# Patient Record
Sex: Female | Born: 2000 | Race: Black or African American | Hispanic: No | Marital: Single | Smoking: Never smoker
Health system: Southern US, Community
[De-identification: ages and names within clinical notes are randomized; demographics above are authoritative.]

---
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
## 2020-01-29 IMAGING — CT CT ABDOMEN AND PELVIS WITH CONTRAST
2 of 4 series · 16 of 46 positions shown, 18 images · IV contrast (omnipaque)
Comparison: None.

CLINICAL DATA: Acute generalized abdominal pain.

EXAM:
CT ABDOMEN AND PELVIS WITH CONTRAST
TECHNIQUE: Multidetector CT imaging of the abdomen and pelvis was performed
using the standard protocol following bolus administration of
intravenous contrast.
CONTRAST:  100mL OMNIPAQUE IOHEXOL 300 MG/ML  SOLN

[Series 3: abd/ pelvis 5.0 i30f 2 · axial · 0.89mm/px · z∈[+694,+1134]mm · 13 of 98 slices shown, 15 images]
[im 5/98  soft-tissue]
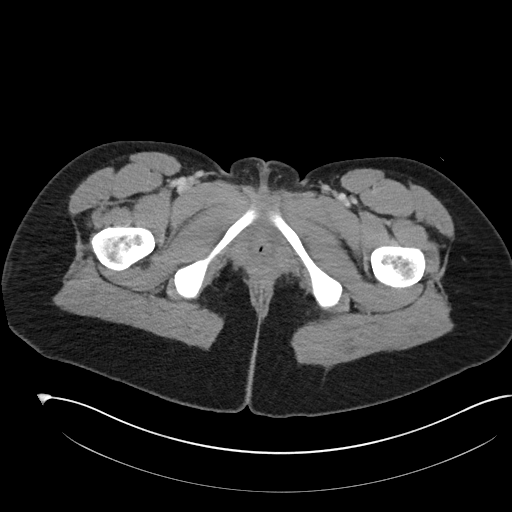
[im 5/98  bone]
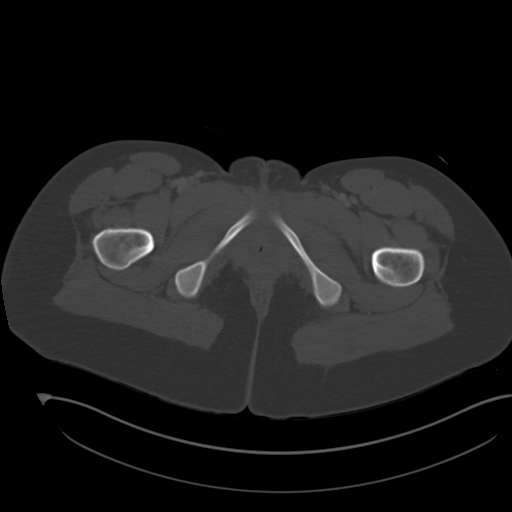
[im 13/98  soft-tissue]
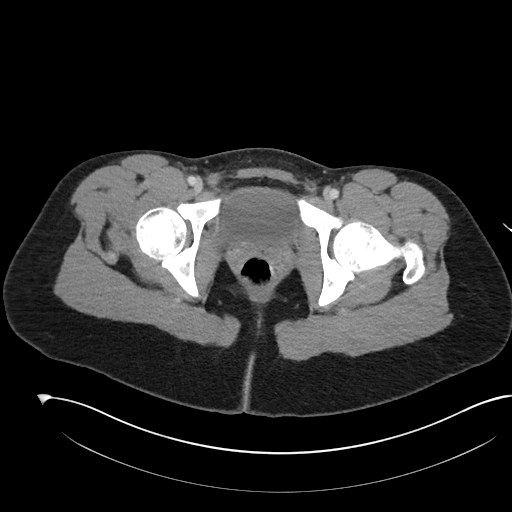
[im 21/98  soft-tissue]
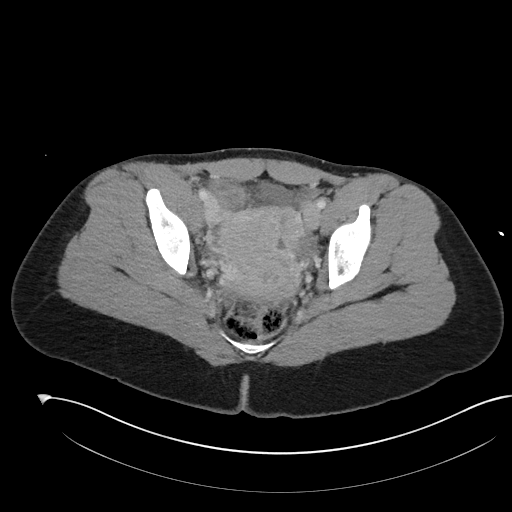
[im 29/98  soft-tissue]
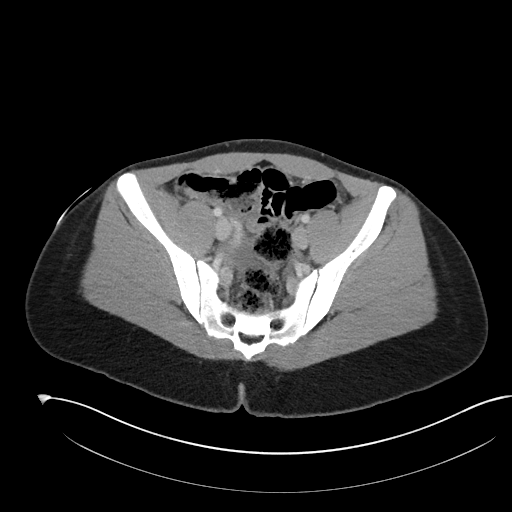
[im 33/98  soft-tissue]
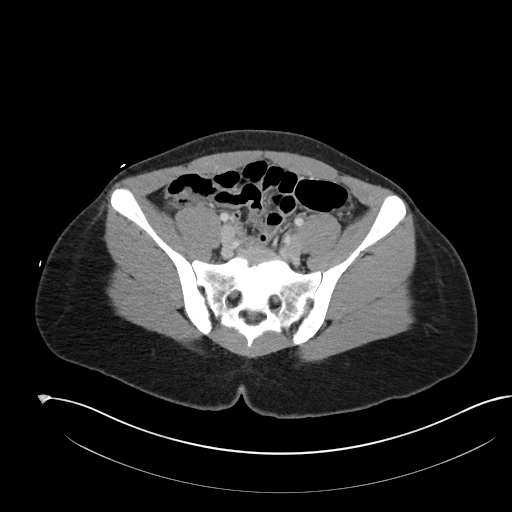
[im 41/98  soft-tissue]
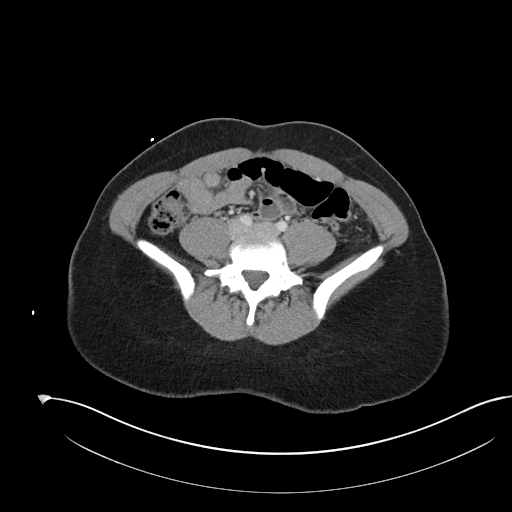
[im 49/98  soft-tissue]
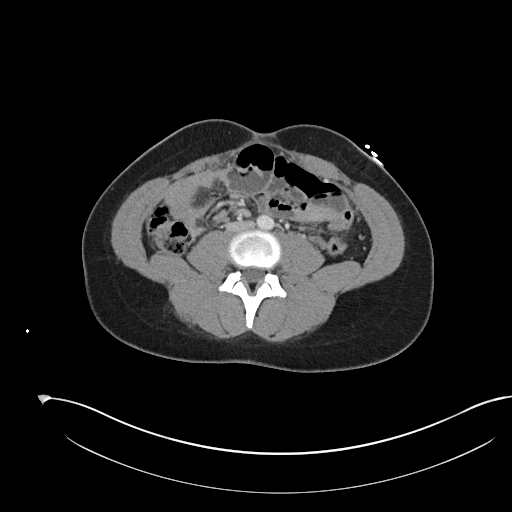
[im 57/98  soft-tissue]
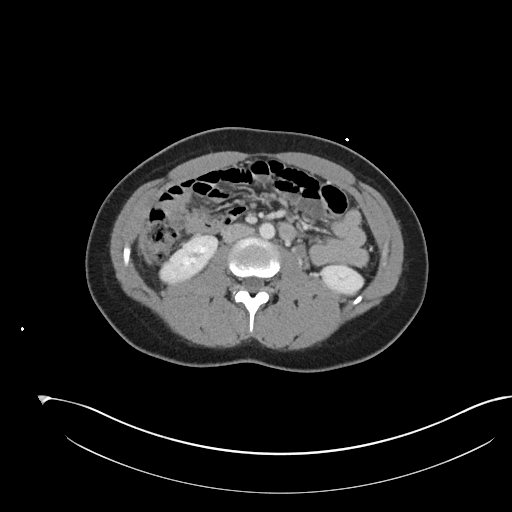
[im 65/98  soft-tissue]
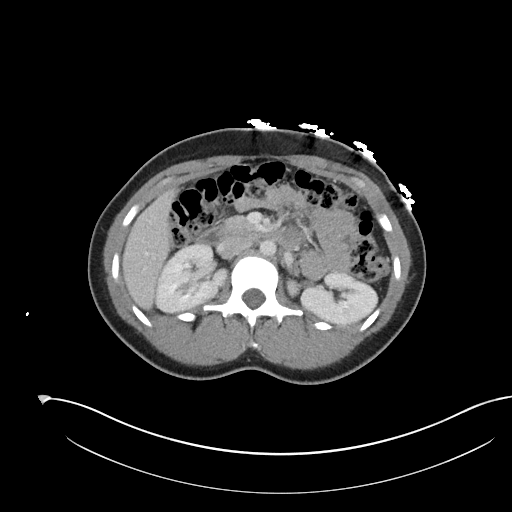
[im 65/98  bone]
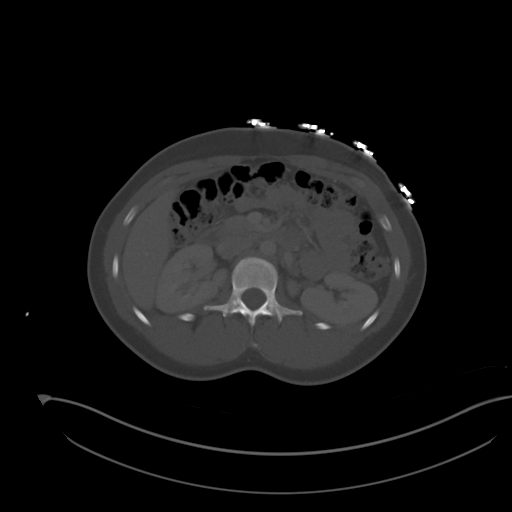
[im 69/98  soft-tissue]
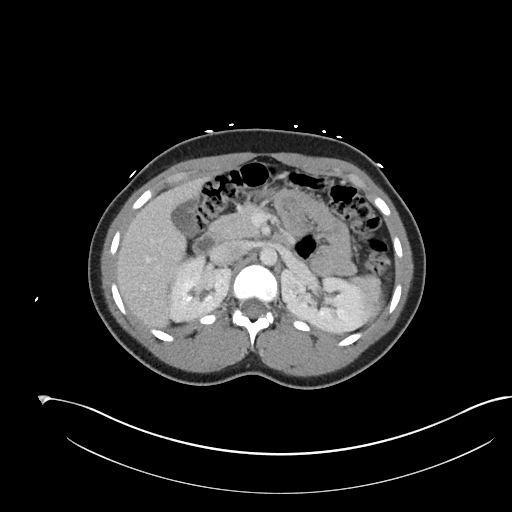
[im 77/98  soft-tissue]
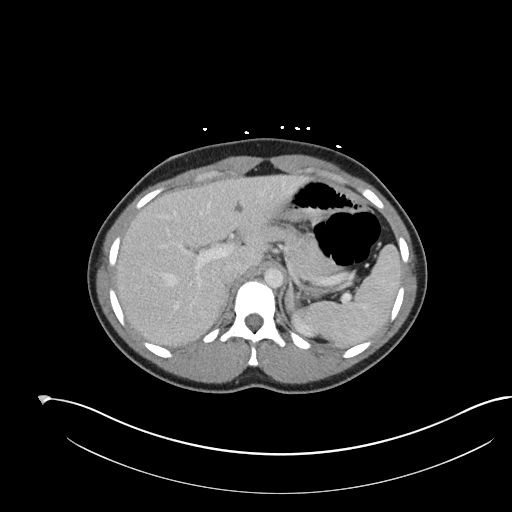
[im 85/98  soft-tissue]
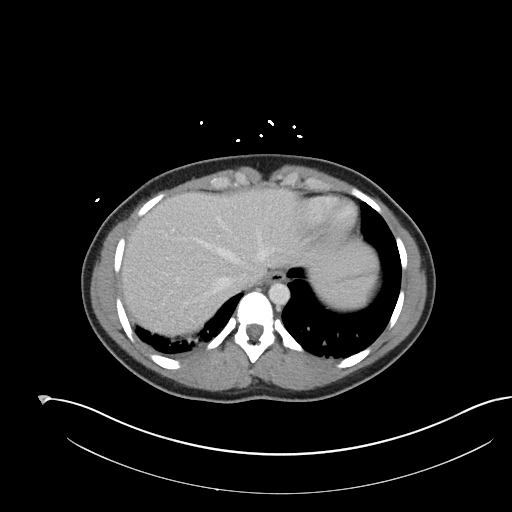
[im 93/98  soft-tissue]
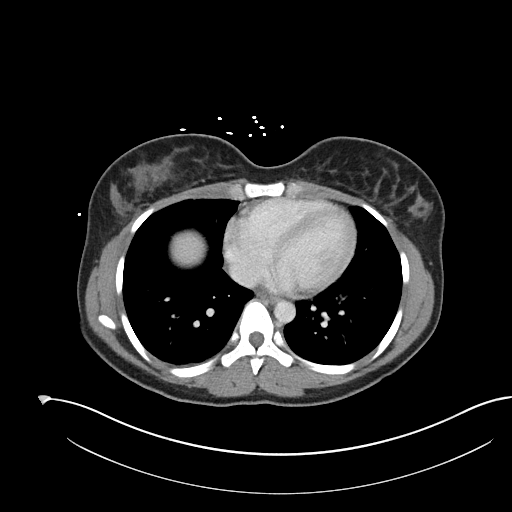

[Series 6: coronal soft tissue · coronal · 0.88mm/px · 3 of 99 slices shown]
[im 33/99  soft-tissue]
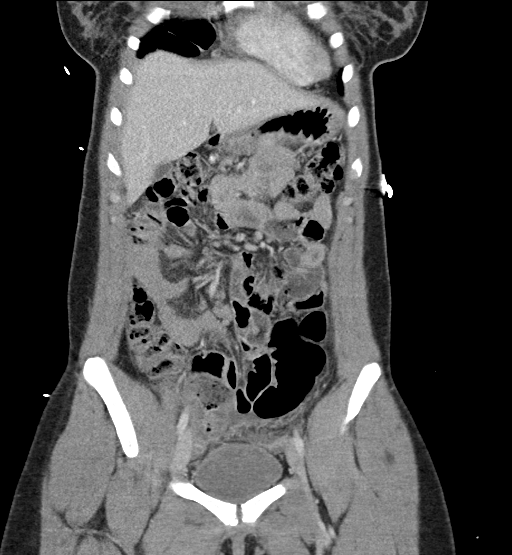
[im 44/99  soft-tissue]
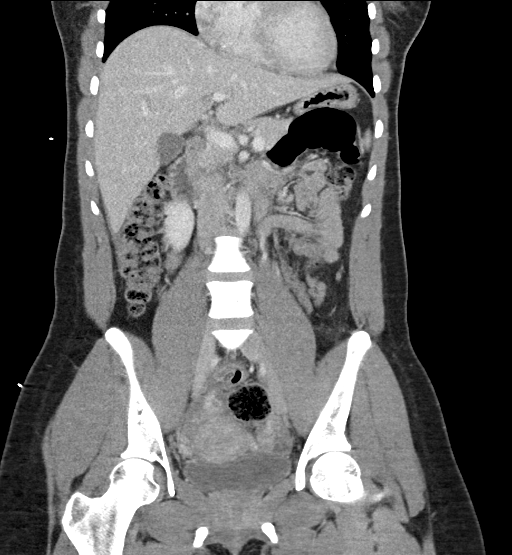
[im 55/99  soft-tissue]
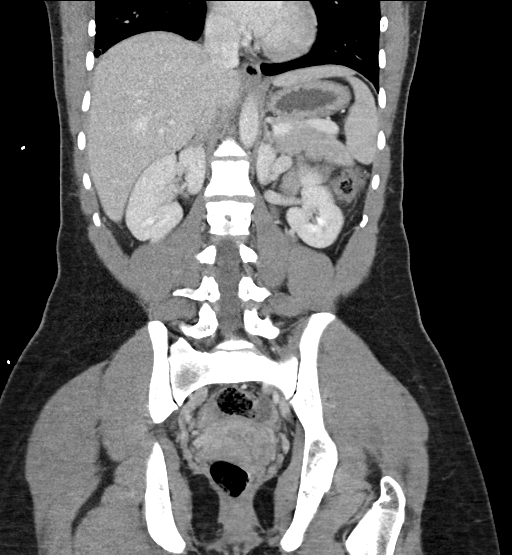

[16 of 46 positions shown; findings below may reference images not displayed]

FINDINGS: Lower chest: There are bibasilar airspace opacities, right worse
than left. There is a trace right-sided pleural effusion.The heart
size is normal.

Hepatobiliary: The liver is normal. Normal gallbladder.There is no
biliary ductal dilation.

Pancreas: Normal contours without ductal dilatation. No
peripancreatic fluid collection.

Spleen: No splenic laceration or hematoma.

Adrenals/Urinary Tract:

--Adrenal glands: No adrenal hemorrhage.

--Right kidney/ureter: No hydronephrosis or perinephric hematoma.

--Left kidney/ureter: No hydronephrosis or perinephric hematoma.

--Urinary bladder: Unremarkable.

Stomach/Bowel:

--Stomach/Duodenum: No hiatal hernia or other gastric abnormality.
Normal duodenal course and caliber.

--Small bowel: No dilatation or inflammation.

--Colon: There appears to be some asymmetric wall thickening of the
cecum (axial series 3, image 76).

--Appendix: The proximal appendix appears somewhat dilated measuring
approximately 9.5 mm (coronal series 6, image 47). The appendix
remains air-filled and appears to taper distally.

Vascular/Lymphatic: Normal course and caliber of the major abdominal
vessels.

--No retroperitoneal lymphadenopathy.

--No mesenteric lymphadenopathy.

--No pelvic or inguinal lymphadenopathy.

Reproductive: There are apparent inflammatory changes in the
patient's anterior pelvis with some associated hyperdense free fluid
(axial series 3, image 77). There is a 2.2 cm cystic structure that
appears to be involving the right ovary.

Other: No ascites or free air. The abdominal wall is normal.

Musculoskeletal. No acute displaced fractures.
IMPRESSION: 1. Inflammatory changes in the patient's pelvis as detailed above.
While the appendix proximally appears dilated, it tapers distally
and remains air-filled. This suggests acute appendicitis is an
unlikely cause of the above findings. Other differential
considerations include pelvic inflammatory disease. There is a
cm cystic structure involving the right ovary which may represent a
hemorrhagic cyst. There is some mild asymmetric wall thickening of
the cecum which may be reactive or represent underlying infectious
or inflammatory colitis.
2. Bibasilar airspace opacities which may represent atelectasis or
pneumonia.
3. Trace right-sided pleural effusion.

## 2020-12-18 ENCOUNTER — Inpatient Hospital Stay (EMERGENCY_DEPARTMENT_HOSPITAL)
Admission: AD | Admit: 2020-12-18 | Discharge: 2020-12-18 | Disposition: A | Discharge status: Home / Self Care | Payer: Medicaid Other | Source: Home / Self Care | Attending: Obstetrics and Gynecology | Admitting: Obstetrics and Gynecology

## 2020-12-18 ENCOUNTER — Emergency Department (HOSPITAL_COMMUNITY)
Admission: EM | Admit: 2020-12-18 | Discharge: 2020-12-19 | Disposition: A | Discharge status: Home / Self Care | Payer: Medicaid Other | Attending: Emergency Medicine | Admitting: Emergency Medicine

## 2020-12-18 ENCOUNTER — Other Ambulatory Visit: Payer: Self-pay

## 2020-12-18 ENCOUNTER — Encounter (HOSPITAL_COMMUNITY): Payer: Self-pay | Admitting: *Deleted

## 2020-12-18 DIAGNOSIS — R0981 Nasal congestion: Secondary | ICD-10-CM | POA: Insufficient documentation

## 2020-12-18 DIAGNOSIS — Z20822 Contact with and (suspected) exposure to covid-19: Secondary | ICD-10-CM | POA: Insufficient documentation

## 2020-12-18 DIAGNOSIS — J069 Acute upper respiratory infection, unspecified: Secondary | ICD-10-CM

## 2020-12-18 DIAGNOSIS — J029 Acute pharyngitis, unspecified: Secondary | ICD-10-CM | POA: Insufficient documentation

## 2020-12-18 DIAGNOSIS — J019 Acute sinusitis, unspecified: Secondary | ICD-10-CM | POA: Diagnosis not present

## 2020-12-18 LAB — GROUP A STREP BY PCR: Group A Strep by PCR: NOT DETECTED

## 2020-12-18 MED ORDER — ACETAMINOPHEN 325 MG PO TABS
650.0000 mg | ORAL_TABLET | Freq: Once | ORAL | Status: AC
  Administered 2020-12-18: 650 mg via ORAL
  Filled 2020-12-18: qty 2

## 2020-12-18 MED ORDER — KETOROLAC TROMETHAMINE 15 MG/ML IJ SOLN
15.0000 mg | Freq: Once | INTRAMUSCULAR | Status: AC
  Administered 2020-12-18: 15 mg via INTRAMUSCULAR
  Filled 2020-12-18: qty 1

## 2020-12-18 MED ORDER — LORATADINE 10 MG PO TABS
10.0000 mg | ORAL_TABLET | Freq: Once | ORAL | Status: AC
  Administered 2020-12-18: 10 mg via ORAL
  Filled 2020-12-18: qty 1

## 2020-12-18 NOTE — ED Provider Notes (Signed)
[facility] EMERGENCY DEPARTMENT Provider Note   CSN: [id number] Arrival date & time: 12/18/20  2136     History Chief Complaint  Patient presents with   URI    Sherry Tran is a 20 y.o. female.  20 year old female presents to the emergency department for evaluation of URI symptoms.  She complains of sore throat with associated pain when swallowing.  Also notes some sinus congestion causing secondary sinus headache.  Minimal headache improvement with Motrin yesterday.  She tried a Nettie pot for her sinus symptoms, but this only worsened her congestion.  Her symptoms started 3 days ago, have remained constant.  No fever, N/V/D, inability to swallow.  Has been tolerating PO.   Using throat lozenges but not quite resolving her symptoms.     URI     History reviewed. No pertinent past medical history.  There are no problems to display for this patient.   History reviewed. No pertinent surgical history.   OB History   No obstetric history on file.     No family history on file.  Social History   Tobacco Use   Smoking status: Never   Smokeless tobacco: Never  Vaping Use   Vaping Use: Never used  Substance Use Topics   Alcohol use: Never   Drug use: Never    Home Medications Prior to Admission medications   Not on File    Allergies    Patient has no known allergies.  Review of Systems   Review of Systems Ten systems reviewed and are negative for acute change, except as noted in the HPI.    Physical Exam Updated Vital Signs BP (!) 145/87 (BP Location: Left Arm)   Pulse (!) 105   Temp 99 F (37.2 C) (Oral)   Resp 17   LMP 12/13/2020   SpO2 100%   Physical Exam Vitals and nursing note reviewed.  Constitutional:      General: She is not in acute distress.    Appearance: She is well-developed. She is not diaphoretic.     Comments: Nontoxic appearing and in NAD  HENT:     Head: Normocephalic and atraumatic.      Right Ear: Tympanic membrane, ear canal and external ear normal.     Left Ear: Tympanic membrane, ear canal and external ear normal.     Nose: Congestion present. No rhinorrhea.     Mouth/Throat:     Mouth: Mucous membranes are moist.     Comments: Posterior oropharyngeal erythema without significant tonsillar enlargement.  No exudates, palatal petechiae, ulcerations.  Normal phonation without tripoding.  Tolerating secretions without difficulty. Eyes:     General: No scleral icterus.    Conjunctiva/sclera: Conjunctivae normal.  Neck:     Comments: No meningismus Pulmonary:     Effort: Pulmonary effort is normal. No respiratory distress.     Breath sounds: No stridor. No wheezing.     Comments: Respirations even and unlabored Musculoskeletal:        General: Normal range of motion.     Cervical back: Normal range of motion.  Skin:    General: Skin is warm and dry.     Coloration: Skin is not pale.     Findings: No erythema or rash.  Neurological:     Mental Status: She is alert and oriented to person, place, and time.     Coordination: Coordination normal.  Psychiatric:        Behavior: Behavior normal.  ED Results / Procedures / Treatments   Labs (all labs ordered are listed, but only abnormal results are displayed) Labs Reviewed  GROUP A STREP BY PCR  SARS CORONAVIRUS 2 (TAT 6-24 HRS)    EKG None  Radiology No results found.  Procedures Procedures   Medications Ordered in ED Medications  ketorolac (TORADOL) 15 MG/ML injection 15 mg (15 mg Intramuscular Given 12/18/20 2259)  acetaminophen (TYLENOL) tablet 650 mg (650 mg Oral Given 12/18/20 2259)  loratadine (CLARITIN) tablet 10 mg (10 mg Oral Given 12/18/20 2259)    ED Course  I have reviewed the triage vital signs and the nursing notes.  Pertinent labs & imaging results that were available during my care of the patient were reviewed by me and considered in my medical decision making (see chart for  details).    MDM Rules/Calculators/A&P                           Patient complaining of symptoms of sinusitis. Mild to moderate symptoms of nasal congestion and scratchy throat with cough for less than 10 days. Patient is afebrile. No concern for acute bacterial rhinosinusitis; likely viral in nature. Does have COVID test pending. Patient discharged with symptomatic treatment. Recommendations for follow-up with primary care physician. Return precautions discussed and provided. Patient discharged in stable condition with no unaddressed concerns.  Sherry Tran was evaluated in Emergency Department on 12/19/2020 for the symptoms described in the history of present illness. She was evaluated in the context of the global COVID-19 pandemic, which necessitated consideration that the patient might be at risk for infection with the SARS-CoV-2 virus that causes COVID-19. Institutional protocols and algorithms that pertain to the evaluation of patients at risk for COVID-19 are in a state of rapid change based on information released by regulatory bodies including the CDC and federal and state organizations. These policies and algorithms were followed during the patient's care in the ED.   Final Clinical Impression(s) / ED Diagnoses Final diagnoses:  Acute sinusitis, recurrence not specified, unspecified location    Rx / DC Orders ED Discharge Orders     None        Antony Madura, PA-C 12/19/20 0002    Sabas Sous, MD 12/19/20 [phone]

## 2020-12-18 NOTE — MAU Note (Signed)
Yesterday, sore throat started and feels like tonsils are swollen.  Lymph nodes feel tender on neck. Head feels full and headache. Denies pregnancy.  LMP was last Tuesday.  No vaginal bleeding.  Had Covid at beginning of this year.

## 2020-12-18 NOTE — Discharge Instructions (Addendum)
You have a COVID test pending. You can access these results through MyChart. If positive, quarantine away from other individuals for 5 days from symptom onset. Take tylenol and/or ibuprofen for fever, headaches, body aches. You may try chloraseptic spray for sore throat.  Drink plenty of fluids to prevent dehydration. Warm liquids may be soothing for your sore throat, such as tea with lemon and honey. You may continue to use other over-the-counter remedies for symptom control, if desired. Return for new or concerning symptoms such as worsening shortness of breath, coughing up blood, persistent vomiting, loss of consciousness.

## 2020-12-18 NOTE — ED Triage Notes (Signed)
Pt c/o headache, sore throat, ringing ears, sinus pressure for 3 days.

## 2020-12-18 NOTE — MAU Provider Note (Signed)
S [name] is a 20 y.o. female patient who presents to MAU today with complaint of sore throat, congestion, and swollen tonsils. Her symptoms started 3 days ago. No fever. No N/V or diarrhea. Has been tolerating PO. Using throat lozenges but not quite resolving her symptoms. No chest pain or SOB. LMP 8/16. Denies pregnancy.  O BP 127/84 (BP Location: Right Arm)   Pulse (!) 116   Temp 99.1 F (37.3 C)   Resp 20   Ht 5\' 8"  (1.727 m)   Wt 103.9 kg   LMP 12/13/2020   SpO2 99%   BMI 34.82 kg/m   Physical Exam Constitutional:      General: She is not in acute distress.    Appearance: She is well-developed. She is not toxic-appearing.  HENT:     Head: Normocephalic and atraumatic.     Nose: Congestion present.     Mouth/Throat:     Mouth: Mucous membranes are moist.     Pharynx: Uvula midline. Posterior oropharyngeal erythema present. No oropharyngeal exudate.     Tonsils: No tonsillar exudate.  Eyes:     Conjunctiva/sclera: Conjunctivae normal.  Cardiovascular:     Rate and Rhythm: Normal rate and regular rhythm.     Heart sounds: Normal heart sounds.  Pulmonary:     Effort: Pulmonary effort is normal.     Breath sounds: Normal breath sounds.  Musculoskeletal:     Cervical back: Normal range of motion.  Skin:    General: Skin is warm and dry.  Neurological:     Mental Status: She is alert and oriented to person, place, and time.  Psychiatric:        Mood and Affect: Mood normal.        Behavior: Behavior normal.   A Medical screening exam complete. VSS. Overall presentation consistent with viral URI.   P Discharge from MAU in stable condition Patient given the option of transfer to [facility] for further evaluation or seek care in outpatient facility of choice  List of options for follow-up given  Warning signs for worsening condition that would warrant emergency follow-up discussed Recommended OTC cold/flu medications for her symptoms Patient may  return to MAU as needed   [facility], MD [facility] Fellow  Faculty Practice 12/18/2020 8:49 PM

## 2020-12-19 LAB — SARS CORONAVIRUS 2 (TAT 6-24 HRS): SARS Coronavirus 2: NEGATIVE

## 2020-12-20 ENCOUNTER — Telehealth: Payer: Medicaid Other | Admitting: Emergency Medicine

## 2020-12-20 DIAGNOSIS — J069 Acute upper respiratory infection, unspecified: Secondary | ICD-10-CM

## 2020-12-20 MED ORDER — BENZONATATE 100 MG PO CAPS: 100.0000 mg | ORAL_CAPSULE | Freq: Two times a day (BID) | ORAL | 0 refills | Status: AC | PRN

## 2020-12-20 MED ORDER — FLUTICASONE PROPIONATE 50 MCG/ACT NA SUSP: 2.0000 | Freq: Every day | NASAL | 0 refills | Status: AC

## 2020-12-20 NOTE — Progress Notes (Signed)

## 2022-01-09 ENCOUNTER — Emergency Department (HOSPITAL_COMMUNITY)
Admission: EM | Admit: 2022-01-09 | Discharge: 2022-01-09 | Disposition: A | Discharge status: Home / Self Care | Payer: Medicaid Other | Attending: Emergency Medicine | Admitting: Emergency Medicine

## 2022-01-09 ENCOUNTER — Other Ambulatory Visit: Payer: Self-pay

## 2022-01-09 DIAGNOSIS — R102 Pelvic and perineal pain: Secondary | ICD-10-CM | POA: Diagnosis present

## 2022-01-09 DIAGNOSIS — N751 Abscess of Bartholin's gland: Secondary | ICD-10-CM | POA: Diagnosis not present

## 2022-01-09 MED ORDER — CLINDAMYCIN HCL 300 MG PO CAPS: 300.0000 mg | ORAL_CAPSULE | Freq: Three times a day (TID) | ORAL | 0 refills | Status: AC

## 2022-01-09 NOTE — ED Notes (Signed)
ED Provider at bedside. 

## 2022-01-09 NOTE — ED Triage Notes (Signed)
Pt arrives with c/o waking up one day with a "bulge" near labia. Pt states "It is hard, its growing, painful. Nothing has helped' Negative hx of same.

## 2022-01-09 NOTE — ED Provider Notes (Signed)
[facility] EMERGENCY DEPARTMENT Provider Note   CSN: [id number] Arrival date & time: 01/09/22  1204     History  Chief Complaint  Patient presents with   Vaginal Pain    Sherry Tran is a 21 y.o. female.  21 year old female presents with complaint of painful swollen area to her left labia which is been present for the past few days, progressively worsening.  Did try warm compresses at home which seemed to help but area persists.  Denies fever, denies possibility of pregnancy.  No other complaints or concerns.       Home Medications Prior to Admission medications   Medication Sig Start Date End Date Taking? Authorizing Provider  clindamycin (CLEOCIN) 300 MG capsule Take 1 capsule (300 mg total) by mouth 3 (three) times daily for 10 days. 01/09/22 01/19/22 Yes Jeannie Fend, PA-C  benzonatate (TESSALON) 100 MG capsule Take 1 capsule (100 mg total) by mouth 2 (two) times daily as needed for cough. 12/20/20   Roxy Horseman, PA-C  fluticasone (FLONASE) 50 MCG/ACT nasal spray Place 2 sprays into both nostrils daily. 12/20/20   Roxy Horseman, PA-C      Allergies    Patient has no known allergies.    Review of Systems   Review of Systems Negative except as per HPI Physical Exam Updated Vital Signs BP 137/88   Pulse (!) 113   Temp 99.4 F (37.4 C) (Oral)   Resp 16   Ht 5\' 9"  (1.753 m)   Wt 81.6 kg   SpO2 100%   BMI 26.58 kg/m  Physical Exam Vitals and nursing note reviewed. Exam conducted with a chaperone present.  Constitutional:      General: She is not in acute distress.    Appearance: She is well-developed. She is not diaphoretic.  HENT:     Head: Normocephalic and atraumatic.  Pulmonary:     Effort: Pulmonary effort is normal.  Genitourinary:   Skin:    General: Skin is warm and dry.  Neurological:     Mental Status: She is alert and oriented to person, place, and time.  Psychiatric:        Behavior: Behavior  normal.     ED Results / Procedures / Treatments   Labs (all labs ordered are listed, but only abnormal results are displayed) Labs Reviewed - No data to display  EKG None  Radiology No results found.  Procedures Procedures    Medications Ordered in ED Medications - No data to display  ED Course/ Medical Decision Making/ A&P                           Medical Decision Making  21 year old female with concern for painful swelling to her left labia.  Exam with chaperone present, appears consistent with Bartholin's gland cyst.  Does have overlying mild erythema, area is firm, no fluctuance, no drainage.  Recommend antibiotics, warm compresses, follow-up with gynecology.        Final Clinical Impression(s) / ED Diagnoses Final diagnoses:  Bartholin's gland abscess    Rx / DC Orders ED Discharge Orders          Ordered    clindamycin (CLEOCIN) 300 MG capsule  3 times daily        01/09/22 1257              03/11/22, PA-C 01/09/22 1300    03/11/22, MD 01/10/22 [phone]

## 2022-01-09 NOTE — Discharge Instructions (Signed)
Antibiotics as prescribed.  Warm compresses as discussed.  Follow-up with your gynecologist as soon as possible or call resources listed below to arrange follow-up.  You can make an appointment to see a GYN provider:   Center for [facility] at [facility]  [address]  [phone]   Center for [facility] Healthcare at [facility]  [address] [employer]  [phone]   Center for [facility] Healthcare at [facility] [address] [place]  [phone]   Center for [facility] Healthcare at [facility]  [address]  [phone]   Center for [employer] [facility]  [address]  [phone]   If you already have an established OB/GYN provider in the area you can make an appointment with them as well.
